# Patient Record
Sex: Female | Born: 1970 | Race: White | Hispanic: No | Marital: Married | Smoking: Current every day smoker
Health system: Southern US, Community
[De-identification: ages and names within clinical notes are randomized; demographics above are authoritative.]

---

## 1999-12-17 ENCOUNTER — Other Ambulatory Visit: Admission: RE | Admit: 1999-12-17 | Discharge: 1999-12-17 | Payer: Self-pay | Admitting: Obstetrics & Gynecology

## 2001-01-16 ENCOUNTER — Other Ambulatory Visit: Admission: RE | Admit: 2001-01-16 | Discharge: 2001-01-16 | Payer: Self-pay | Admitting: Obstetrics and Gynecology

## 2002-01-21 ENCOUNTER — Other Ambulatory Visit: Admission: RE | Admit: 2002-01-21 | Discharge: 2002-01-21 | Payer: Self-pay | Admitting: Obstetrics and Gynecology

## 2003-01-12 ENCOUNTER — Other Ambulatory Visit: Admission: RE | Admit: 2003-01-12 | Discharge: 2003-01-12 | Payer: Self-pay | Admitting: Obstetrics & Gynecology

## 2004-02-02 ENCOUNTER — Ambulatory Visit (HOSPITAL_COMMUNITY): Admission: RE | Admit: 2004-02-02 | Discharge: 2004-02-02 | Payer: Self-pay | Admitting: Gynecology

## 2004-02-06 ENCOUNTER — Other Ambulatory Visit: Admission: RE | Admit: 2004-02-06 | Discharge: 2004-02-06 | Payer: Self-pay | Admitting: Obstetrics & Gynecology

## 2005-02-12 ENCOUNTER — Encounter: Admission: RE | Admit: 2005-02-12 | Discharge: 2005-02-12 | Payer: Self-pay | Admitting: Plastic Surgery

## 2005-02-18 ENCOUNTER — Ambulatory Visit (HOSPITAL_COMMUNITY): Admission: RE | Admit: 2005-02-18 | Discharge: 2005-02-18 | Payer: Self-pay | Admitting: Plastic Surgery

## 2005-02-28 ENCOUNTER — Encounter: Admission: RE | Admit: 2005-02-28 | Discharge: 2005-02-28 | Payer: Self-pay | Admitting: Plastic Surgery

## 2005-03-04 ENCOUNTER — Ambulatory Visit (HOSPITAL_COMMUNITY): Admission: RE | Admit: 2005-03-04 | Discharge: 2005-03-04 | Payer: Self-pay | Admitting: Plastic Surgery

## 2005-08-08 ENCOUNTER — Ambulatory Visit (HOSPITAL_COMMUNITY): Admission: RE | Admit: 2005-08-08 | Discharge: 2005-08-08 | Payer: Self-pay | Admitting: Plastic Surgery

## 2005-11-12 ENCOUNTER — Other Ambulatory Visit: Admission: RE | Admit: 2005-11-12 | Discharge: 2005-11-12 | Payer: Self-pay | Admitting: Obstetrics & Gynecology

## 2007-11-10 ENCOUNTER — Encounter: Admission: RE | Admit: 2007-11-10 | Discharge: 2007-11-10 | Payer: Self-pay | Admitting: Plastic Surgery

## 2007-12-10 ENCOUNTER — Encounter: Admission: RE | Admit: 2007-12-10 | Discharge: 2008-03-09 | Payer: Self-pay | Admitting: Family Medicine

## 2008-08-25 ENCOUNTER — Encounter: Admission: RE | Admit: 2008-08-25 | Discharge: 2008-08-25 | Payer: Self-pay | Admitting: Plastic Surgery

## 2009-06-09 ENCOUNTER — Encounter: Admission: RE | Admit: 2009-06-09 | Discharge: 2009-06-09 | Payer: Self-pay | Admitting: Plastic Surgery

## 2010-07-13 ENCOUNTER — Encounter: Admission: RE | Admit: 2010-07-13 | Discharge: 2010-07-13 | Payer: Self-pay | Admitting: Family Medicine

## 2010-10-15 ENCOUNTER — Encounter: Admission: RE | Admit: 2010-10-15 | Discharge: 2010-10-15 | Payer: Self-pay | Admitting: Family Medicine

## 2011-04-12 NOTE — Op Note (Signed)
Joyce Trevino, Joyce Trevino            ACCOUNT NO.:  [id number]   MEDICAL RECORD NO.:  [id number]          PATIENT TYPE:  AMB   LOCATION:  SDS                          FACILITY:  [facility]   PHYSICIAN:  Brantley Persons, M.D.DATE OF BIRTH:  02-Jun-1971   DATE OF PROCEDURE:  08/08/2005  DATE OF DISCHARGE:  08/08/2005                                 OPERATIVE REPORT   PREOPERATIVE DIAGNOSIS:  Absence of left breast implant, status post left  mastopexy augmentation, with subsequent implant infection.   POSTOPERATIVE DIAGNOSIS:  Absence of left breast implant, status post left  mastopexy augmentation, with subsequent implant infection.   PROCEDURE:  Revisional left breast augmentation.   ATTENDING SURGEON:  Leodis Liverpool, MD.   ANESTHESIA:  General endotracheal.   ANESTHESIOLOGIST:  Sheldon Silvan, MD.   ESTIMATED BLOOD LOSS:  150 mL.   FLUID REPLACEMENT:  1900 mL crystalloid.   COMPLICATIONS:  None.   INDICATIONS FOR PROCEDURE:  The patient is a 40 year old Caucasian female,  who has previously undergone a bilateral periareolar mastopexy augmentation  in January of 2006.  This had actually been a revisional mastopexy  augmentation in which she had larger implants placed.  Postoperatively she  developed a seroma and a wound infection in the left breast implant and  wound.  This required drainage of the seroma and removal of the left breast  implant.  The patient's implant did grow out methicillin-resistant Staph  aureus that has been appropriately treated as per guidelines given by Dr.  Burnice Logan of the Infectious Disease team.  Clinically, the patient  appears to have no further symptoms of the infection present, and it has  been a good four months since the implant was removed.  She now presents to  undergo re-augmentation of the left breast since she would like to have a  comparable size and shape to her two breasts.  The right nipple-areolar  complex has stretched out  some due to the tension of the right implant and  it is larger than the nipple-areolar complex on the left.  At this point, I  will not proceed with providing a revision of the nipple-areolar complexes,  rather we will work on re-augmenting the left breast and then allowing  things to settle in and decide what else may need to be performed in order  to give her better symmetry between the nipple-areolar complexes.  The left  nipple-areolar complex is significantly smaller than the right at this  point.   DESCRIPTION OF PROCEDURE:  The patient was marked in the preop holding area  for future left breast augmentation, then she was taken back to the OR and  placed on the table in the supine position.  After adequate general  endotracheal anesthesia was obtained, the patient's chest was prepped with  Betadine and alcohol, and draped in a sterile fashion.  The base of the left  breast was then injected with a combination of 1% Lidocaine with  epinephrine, half-percent Marcaine plain, and sterile water.  After the  appropriate amount had been injected into the base of the left breast, we  then proceeded  with the surgery.  The breast and chest were prepped with  Betadine and alcohol, and draped in a sterile fashion.  I used a left  inferolateral, periareolar incision for access to the breast tissue.  The  incision was made with the knife and then carried down through the  subcutaneous tissues using the Bovie electrocautery.  Bleeding was stopped  by both the electrocautery as well as by light pressure.  As the breast  tissue was encountered, I then proceeded to divide the breast tissue with  the Bovie electrocautery and go more lateral, reaching the lateral border of  the pectoralis major muscle.  The fascia along the lateral aspect of the  pectoralis major muscle was identified.  I then incised the fascia and this  allowed access to the subpectoral pocket.  There was a significant scarring   present both on the pectoral fascia as well as in the subfascial plane.  This likely was due to her previous surgery and the healing in from that.  The pectoral muscle was then elevated, and a subpectoral pocket was created  using both blunt as well as electrocautery dissection.  After an appropriate  pocket had been created, the wound was then irrigated with saline irrigation  followed by antibiotic irrigation using Bacitracin and Polymyxin.  Next, a  Mentor, Reference #[id number], Lot [id number], 425 mL, round, smooth, saline  implant was placed and filled to a total of 475 mL.  With her previous  surgery, the patient had a 425 mL, round, smooth, Spectrum Mentor implant  placed and filled to a total volume of 460 mL, however, since she did have  the infection and had a small amount of tissue debrided, she likely may have  lost a little bit of volume so I felt that the extra 15 mL would be  appropriate.  Also, in comparison between the breasts, it appeared that the  right breast may have been just a little larger compared to the left when  only 460 mL were present.  The 475 mL seemed to give a better balance.   The patient was then placed in the upright position.  Some minor  modifications were made to the implant pocket.  There was a small amount of  puckering present along the left lower quadrant healed incision that was  present and, in order to smooth this out, I performed just a small amount of  defatting along part of the ends of the repaired incision in that area.  This appeared to allow it to lay down smoother and nicer for the patient.  The patient's breast contours were once again compared, and she was found to  have good symmetry between her size and shape.  The inferior aspect of both  implants appeared to be in a similar position, and the superior, lateral,  medial contours also appeared to be rather symmetric.  The patient was then placed back into the recumbent position.  The  posterior field tube was  removed from the Spectrum implant, and the tubing was checked and found to  be properly seated.  I then proceeded with closure of the periareolar  incision.  The deeper subcutaneous tissues were closed using 3-0 Monocryl  suture.  The dermal layer was closed using 4-0 Monocryl suture.  The skin  was then closed with a 5-0 Monocryl in a running, intracuticular stitch.  Prior to closing the wound, the wound had been irrigated with both saline as  well as antibiotic irrigation.  The patient had tolerated the procedure  well.  Meticulous hemostasis had been obtained with the Bovie  electrocautery.  The periareolar incision was dressed with Benzoin and Steri-  Strips.  Some Elastoplast tape was then placed around the base of the re-  augmented left breast as well as laterally just to give a small amount of  support there while everything is healing in.  The patient then had  Bacitracin ointment applied over the nipple followed by an Adaptic and 4x4s.  There were no complications.  The patient tolerated the procedure well.  She  was then extubated and taken to the recovery room in stable condition.  Both  the patient and her husband were given proper postoperative wound care  instructions, then she was discharged home in his care in stable condition.  A followup appointment will be tomorrow in the office.           ______________________________  Brantley Persons, M.D.     MC/MEDQ  D:  08/09/2005  T:  08/10/2005  Job:  [id number]

## 2012-09-04 ENCOUNTER — Other Ambulatory Visit: Payer: Self-pay | Admitting: Obstetrics & Gynecology

## 2012-09-04 DIAGNOSIS — R928 Other abnormal and inconclusive findings on diagnostic imaging of breast: Secondary | ICD-10-CM

## 2012-09-11 ENCOUNTER — Other Ambulatory Visit: Payer: Self-pay

## 2012-09-29 ENCOUNTER — Ambulatory Visit
Admission: RE | Admit: 2012-09-29 | Discharge: 2012-09-29 | Disposition: A | Discharge status: Home / Self Care | Payer: PRIVATE HEALTH INSURANCE | Source: Ambulatory Visit | Attending: Obstetrics & Gynecology | Admitting: Obstetrics & Gynecology

## 2012-09-29 DIAGNOSIS — R928 Other abnormal and inconclusive findings on diagnostic imaging of breast: Secondary | ICD-10-CM

## 2013-04-28 ENCOUNTER — Other Ambulatory Visit: Payer: Self-pay | Admitting: Family Medicine

## 2013-04-28 DIAGNOSIS — N6313 Unspecified lump in the right breast, lower outer quadrant: Secondary | ICD-10-CM

## 2013-04-28 DIAGNOSIS — N6314 Unspecified lump in the right breast, lower inner quadrant: Secondary | ICD-10-CM

## 2013-05-12 ENCOUNTER — Ambulatory Visit
Admission: RE | Admit: 2013-05-12 | Discharge: 2013-05-12 | Disposition: A | Discharge status: Home / Self Care | Payer: PRIVATE HEALTH INSURANCE | Source: Ambulatory Visit | Attending: Family Medicine | Admitting: Family Medicine

## 2013-05-12 DIAGNOSIS — N6313 Unspecified lump in the right breast, lower outer quadrant: Secondary | ICD-10-CM

## 2013-05-12 DIAGNOSIS — N6314 Unspecified lump in the right breast, lower inner quadrant: Secondary | ICD-10-CM

## 2016-06-05 ENCOUNTER — Encounter (HOSPITAL_COMMUNITY): Payer: Self-pay | Admitting: Emergency Medicine

## 2016-06-05 DIAGNOSIS — R42 Dizziness and giddiness: Secondary | ICD-10-CM | POA: Insufficient documentation

## 2016-06-05 DIAGNOSIS — F17200 Nicotine dependence, unspecified, uncomplicated: Secondary | ICD-10-CM | POA: Insufficient documentation

## 2016-06-05 DIAGNOSIS — R5383 Other fatigue: Secondary | ICD-10-CM | POA: Diagnosis present

## 2016-06-05 NOTE — ED Notes (Signed)
Pt. reports generalized weakness , dizziness and fatigue onset this evening with bilateral arms tingling / pain .

## 2016-06-06 ENCOUNTER — Emergency Department (HOSPITAL_COMMUNITY)
Admission: EM | Admit: 2016-06-06 | Discharge: 2016-06-06 | Disposition: A | Discharge status: Home / Self Care | Payer: PRIVATE HEALTH INSURANCE | Attending: Emergency Medicine | Admitting: Emergency Medicine

## 2016-06-06 DIAGNOSIS — R42 Dizziness and giddiness: Secondary | ICD-10-CM

## 2016-06-06 LAB — COMPREHENSIVE METABOLIC PANEL
ALT: 21 U/L (ref 14–54)
AST: 19 U/L (ref 15–41)
Albumin: 4.1 g/dL (ref 3.5–5.0)
Alkaline Phosphatase: 58 U/L (ref 38–126)
Anion gap: 8 (ref 5–15)
BUN: 23 mg/dL — ABNORMAL HIGH (ref 6–20)
CHLORIDE: 103 mmol/L (ref 101–111)
CO2: 25 mmol/L (ref 22–32)
Calcium: 9.5 mg/dL (ref 8.9–10.3)
Creatinine, Ser: 0.97 mg/dL (ref 0.44–1.00)
GLUCOSE: 101 mg/dL — AB (ref 65–99)
POTASSIUM: 3.4 mmol/L — AB (ref 3.5–5.1)
SODIUM: 136 mmol/L (ref 135–145)
Total Bilirubin: 0.5 mg/dL (ref 0.3–1.2)
Total Protein: 7.3 g/dL (ref 6.5–8.1)

## 2016-06-06 LAB — CBC WITH DIFFERENTIAL/PLATELET
BASOS ABS: 0 10*3/uL (ref 0.0–0.1)
Basophils Relative: 0 %
EOS ABS: 0.1 10*3/uL (ref 0.0–0.7)
EOS PCT: 1 %
HCT: 44.6 % (ref 36.0–46.0)
Hemoglobin: 15.4 g/dL — ABNORMAL HIGH (ref 12.0–15.0)
LYMPHS PCT: 37 %
Lymphs Abs: 3.7 10*3/uL (ref 0.7–4.0)
MCH: 31.4 pg (ref 26.0–34.0)
MCHC: 34.5 g/dL (ref 30.0–36.0)
MCV: 90.8 fL (ref 78.0–100.0)
MONO ABS: 0.6 10*3/uL (ref 0.1–1.0)
Monocytes Relative: 6 %
Neutro Abs: 5.6 10*3/uL (ref 1.7–7.7)
Neutrophils Relative %: 56 %
PLATELETS: 306 10*3/uL (ref 150–400)
RBC: 4.91 MIL/uL (ref 3.87–5.11)
RDW: 12.2 % (ref 11.5–15.5)
WBC: 10.1 10*3/uL (ref 4.0–10.5)

## 2016-06-06 LAB — URINALYSIS, ROUTINE W REFLEX MICROSCOPIC
Bilirubin Urine: NEGATIVE
GLUCOSE, UA: NEGATIVE mg/dL
Hgb urine dipstick: NEGATIVE
KETONES UR: NEGATIVE mg/dL
LEUKOCYTES UA: NEGATIVE
NITRITE: NEGATIVE
PH: 7 (ref 5.0–8.0)
Protein, ur: NEGATIVE mg/dL
SPECIFIC GRAVITY, URINE: 1.014 (ref 1.005–1.030)

## 2016-06-06 LAB — TROPONIN I: Troponin I: <0.03 ng/mL

## 2016-06-06 LAB — POC URINE PREG, ED: Preg Test, Ur: NEGATIVE

## 2016-06-06 MED ORDER — SODIUM CHLORIDE 0.9 % IV BOLUS (SEPSIS)
1000.0000 mL | Freq: Once | INTRAVENOUS | Status: AC
  Administered 2016-06-06: 1000 mL via INTRAVENOUS

## 2016-06-06 NOTE — ED Notes (Signed)
Pt departed in NAD, refused wheelchair.  

## 2016-06-06 NOTE — ED Provider Notes (Signed)
CSN: [id number]     Arrival date & time 06/05/16  2330 History  By signing my name below, I, Freida Busmaniana Omoyeni, attest that this documentation has been prepared under the direction and in the presence of Pricilla LovelessScott Cuba Natarajan, MD . Electronically Signed: Freida Busmaniana Omoyeni, Scribe. 06/06/2016. 3:13 AM.  Chief Complaint  Patient presents with  . Fatigue  . Dizziness   The history is provided by the patient. No language interpreter was used.     HPI Comments:  Alger MemosSherry A Rohrig is a 45 y.o. female who presents to the Emergency Department complaining of lightheadedness which began ~2100. She reports associated LUE pain and tingling in her bilateral hands, which has improved at this time.  She notes recent change in her diet as she is trying to lose weight; states she has cut down on her "starches".  Pt reports h/o similar episodes but usually in the AM. She reports taking a glucose tablet and drinking orange juice without relief PTA. She denies CP, nausea, vomiting, abdominal pain, and SOB. Pt is a current smoker.   History reviewed. No pertinent past medical history. History reviewed. No pertinent past surgical history. No family history on file. Social History  Substance Use Topics  . Smoking status: Current Every Day Smoker  . Smokeless tobacco: None  . Alcohol Use: Yes   OB History    No data available     Review of Systems  Constitutional: Negative for fever and chills.  Respiratory: Negative for shortness of breath.   Cardiovascular: Negative for chest pain.  Gastrointestinal: Negative for nausea, vomiting and abdominal pain.  Neurological: Positive for light-headedness.  All other systems reviewed and are negative.   Allergies  Penicillins  Home Medications   Prior to Admission medications   Not on File   BP 149/107 mmHg  Pulse 98  Temp(Src) 98 F (36.7 C) (Oral)  Resp 16  Ht 5\' 5"  (1.651 m)  Wt 172 lb (78.019 kg)  BMI 28.62 kg/m2  SpO2 97%  LMP 05/28/2016  (Approximate) Physical Exam  Constitutional: She is oriented to person, place, and time. She appears well-developed and well-nourished.  HENT:  Head: Normocephalic and atraumatic.  Right Ear: External ear normal.  Left Ear: External ear normal.  Nose: Nose normal.  Eyes: Right eye exhibits no discharge. Left eye exhibits no discharge.  Cardiovascular: Normal rate, regular rhythm and normal heart sounds.   Pulses:      Radial pulses are 2+ on the right side, and 2+ on the left side.  Pulmonary/Chest: Effort normal and breath sounds normal.  Abdominal: Soft. There is no tenderness.  Neurological: She is alert and oriented to person, place, and time.  CN 3-12 grossly intact 5/5 strength in all 4 extremities Grossly normal sensation Normal finger to nose and normal gait   Skin: Skin is warm and dry.  Nursing note and vitals reviewed.   ED Course  Procedures   DIAGNOSTIC STUDIES:  Oxygen Saturation is 97% on RA, normal by my interpretation.    COORDINATION OF CARE:  3:07 AM Discussed treatment plan with pt at bedside and pt agreed to plan.  Labs Review Labs Reviewed  CBC WITH DIFFERENTIAL/PLATELET - Abnormal; Notable for the following:    Hemoglobin 15.4 (*)    All other components within normal limits  COMPREHENSIVE METABOLIC PANEL - Abnormal; Notable for the following:    Potassium 3.4 (*)    Glucose, Bld 101 (*)    BUN 23 (*)    All other  components within normal limits  URINALYSIS, ROUTINE W REFLEX MICROSCOPIC (NOT AT [facility])  TROPONIN I  POC URINE PREG, ED    Imaging Review No results found. I have personally reviewed and evaluated these images and lab results as part of my medical decision-making.   EKG Interpretation   Date/Time:  Thursday June 06 2016 03:25:53 EDT Ventricular Rate:  76 PR Interval:    QRS Duration: 92 QT Interval:  394 QTC Calculation: 443 R Axis:   98 Text Interpretation:  Sinus rhythm Borderline right axis deviation no  acute ST/T  changes No old tracing to compare Confirmed by Sharifa Bucholz MD,  Hannalee Castor [phone]) on 06/06/2016 3:38:30 AM      MDM   Final diagnoses:  Lightheadedness    Patient's lightheadedness tonight as well as recently is probably due to diet changes and decreased carbs. Unclear if she was specifically hypoglycemic as she had already taken glucose and juice prior to arrival. However I counseled her on talking to her PCP and dietitian about these diet changes. Could be a little dehydrated based on mildly elevated BUN and elevated hemoglobin. Feels better after fluids. She describes vague left arm pain but no chest pain. With benign ECG and negative troponin several hours after the symptoms I highly doubt ACS. Follow-up with PCP. Discussed return precautions.  I personally performed the services described in this documentation, which was scribed in my presence. The recorded information has been reviewed and is accurate.    Pricilla Loveless, MD 06/06/16 [phone]

## 2019-03-26 ENCOUNTER — Ambulatory Visit
Admission: RE | Admit: 2019-03-26 | Discharge: 2019-03-26 | Disposition: A | Discharge status: Home / Self Care | Payer: PRIVATE HEALTH INSURANCE | Source: Ambulatory Visit | Attending: Family Medicine | Admitting: Family Medicine

## 2019-03-26 ENCOUNTER — Other Ambulatory Visit: Payer: Self-pay | Admitting: Family Medicine

## 2019-03-26 DIAGNOSIS — R059 Cough, unspecified: Secondary | ICD-10-CM

## 2019-03-26 DIAGNOSIS — R05 Cough: Secondary | ICD-10-CM

## 2019-06-18 ENCOUNTER — Other Ambulatory Visit: Payer: Self-pay | Admitting: Family Medicine

## 2019-06-24 ENCOUNTER — Other Ambulatory Visit (HOSPITAL_COMMUNITY)
Admission: RE | Admit: 2019-06-24 | Discharge: 2019-06-24 | Disposition: A | Discharge status: Home / Self Care | Payer: PRIVATE HEALTH INSURANCE | Source: Ambulatory Visit | Attending: Family Medicine | Admitting: Family Medicine

## 2019-06-24 DIAGNOSIS — Z20828 Contact with and (suspected) exposure to other viral communicable diseases: Secondary | ICD-10-CM | POA: Insufficient documentation

## 2019-06-24 LAB — SARS CORONAVIRUS 2 (TAT 6-24 HRS): SARS Coronavirus 2: NEGATIVE

## 2019-06-25 ENCOUNTER — Other Ambulatory Visit: Payer: Self-pay | Admitting: *Deleted

## 2019-06-25 DIAGNOSIS — R0600 Dyspnea, unspecified: Secondary | ICD-10-CM

## 2019-06-28 ENCOUNTER — Other Ambulatory Visit: Payer: Self-pay

## 2019-06-28 ENCOUNTER — Ambulatory Visit (INDEPENDENT_AMBULATORY_CARE_PROVIDER_SITE_OTHER): Payer: PRIVATE HEALTH INSURANCE | Admitting: Internal Medicine

## 2019-06-28 DIAGNOSIS — R0600 Dyspnea, unspecified: Secondary | ICD-10-CM | POA: Diagnosis not present

## 2019-06-28 LAB — PULMONARY FUNCTION TEST
DL/VA % pred: 82 %
DL/VA: 3.54 ml/min/mmHg/L
DLCO unc % pred: 87 %
DLCO unc: 18.69 ml/min/mmHg
FEF 25-75 Post: 1.89 L/sec
FEF 25-75 Pre: 1.24 L/sec
FEF2575-%Change-Post: 52 %
FEF2575-%Pred-Post: 67 %
FEF2575-%Pred-Pre: 44 %
FEV1-%Change-Post: 12 %
FEV1-%Pred-Post: 86 %
FEV1-%Pred-Pre: 76 %
FEV1-Post: 2.46 L
FEV1-Pre: 2.2 L
FEV1FVC-%Change-Post: 4 %
FEV1FVC-%Pred-Pre: 82 %
FEV6-%Change-Post: 8 %
FEV6-%Pred-Post: 99 %
FEV6-%Pred-Pre: 92 %
FEV6-Post: 3.5 L
FEV6-Pre: 3.24 L
FEV6FVC-%Change-Post: 1 %
FEV6FVC-%Pred-Post: 101 %
FEV6FVC-%Pred-Pre: 100 %
FVC-%Change-Post: 6 %
FVC-%Pred-Post: 98 %
FVC-%Pred-Pre: 91 %
FVC-Post: 3.53 L
FVC-Pre: 3.3 L
Post FEV1/FVC ratio: 70 %
Post FEV6/FVC ratio: 99 %
Pre FEV1/FVC ratio: 67 %
Pre FEV6/FVC Ratio: 98 %
RV % pred: 167 %
RV: 3.04 L
TLC % pred: 123 %
TLC: 6.33 L

## 2019-06-28 NOTE — Progress Notes (Signed)
PFT done today. 

## 2019-08-11 ENCOUNTER — Telehealth: Payer: Self-pay | Admitting: Internal Medicine

## 2019-08-11 NOTE — Telephone Encounter (Signed)
Spoke with the Joyce Trevino  She states that Dr Kathyrn Lass order the PFT but is giving her the run around about going over the results  She is wondering if Dr Annamaria Boots can go over the results of her PFT  Please advise thanks

## 2019-08-12 NOTE — Telephone Encounter (Signed)
Spoke with the pt and notified of recs per CDY  She verbalized understanding  Will f/u with her PCP

## 2019-08-12 NOTE — Telephone Encounter (Signed)
The PFT showed mild obstructive airways disease, with slight improvement after she inhaled the bronchodilator. These results will need to be interpreted in her particular case by the doctor who knows her and knows what the clinical situation is.

## 2021-04-17 IMAGING — CR CHEST - 2 VIEW
2 series · 2 of 2 positions shown · non-contrast
Comparison: None.

CLINICAL DATA: Shortness of breath and cough for several weeks.

EXAM:
CHEST - 2 VIEW

[w chest pa]
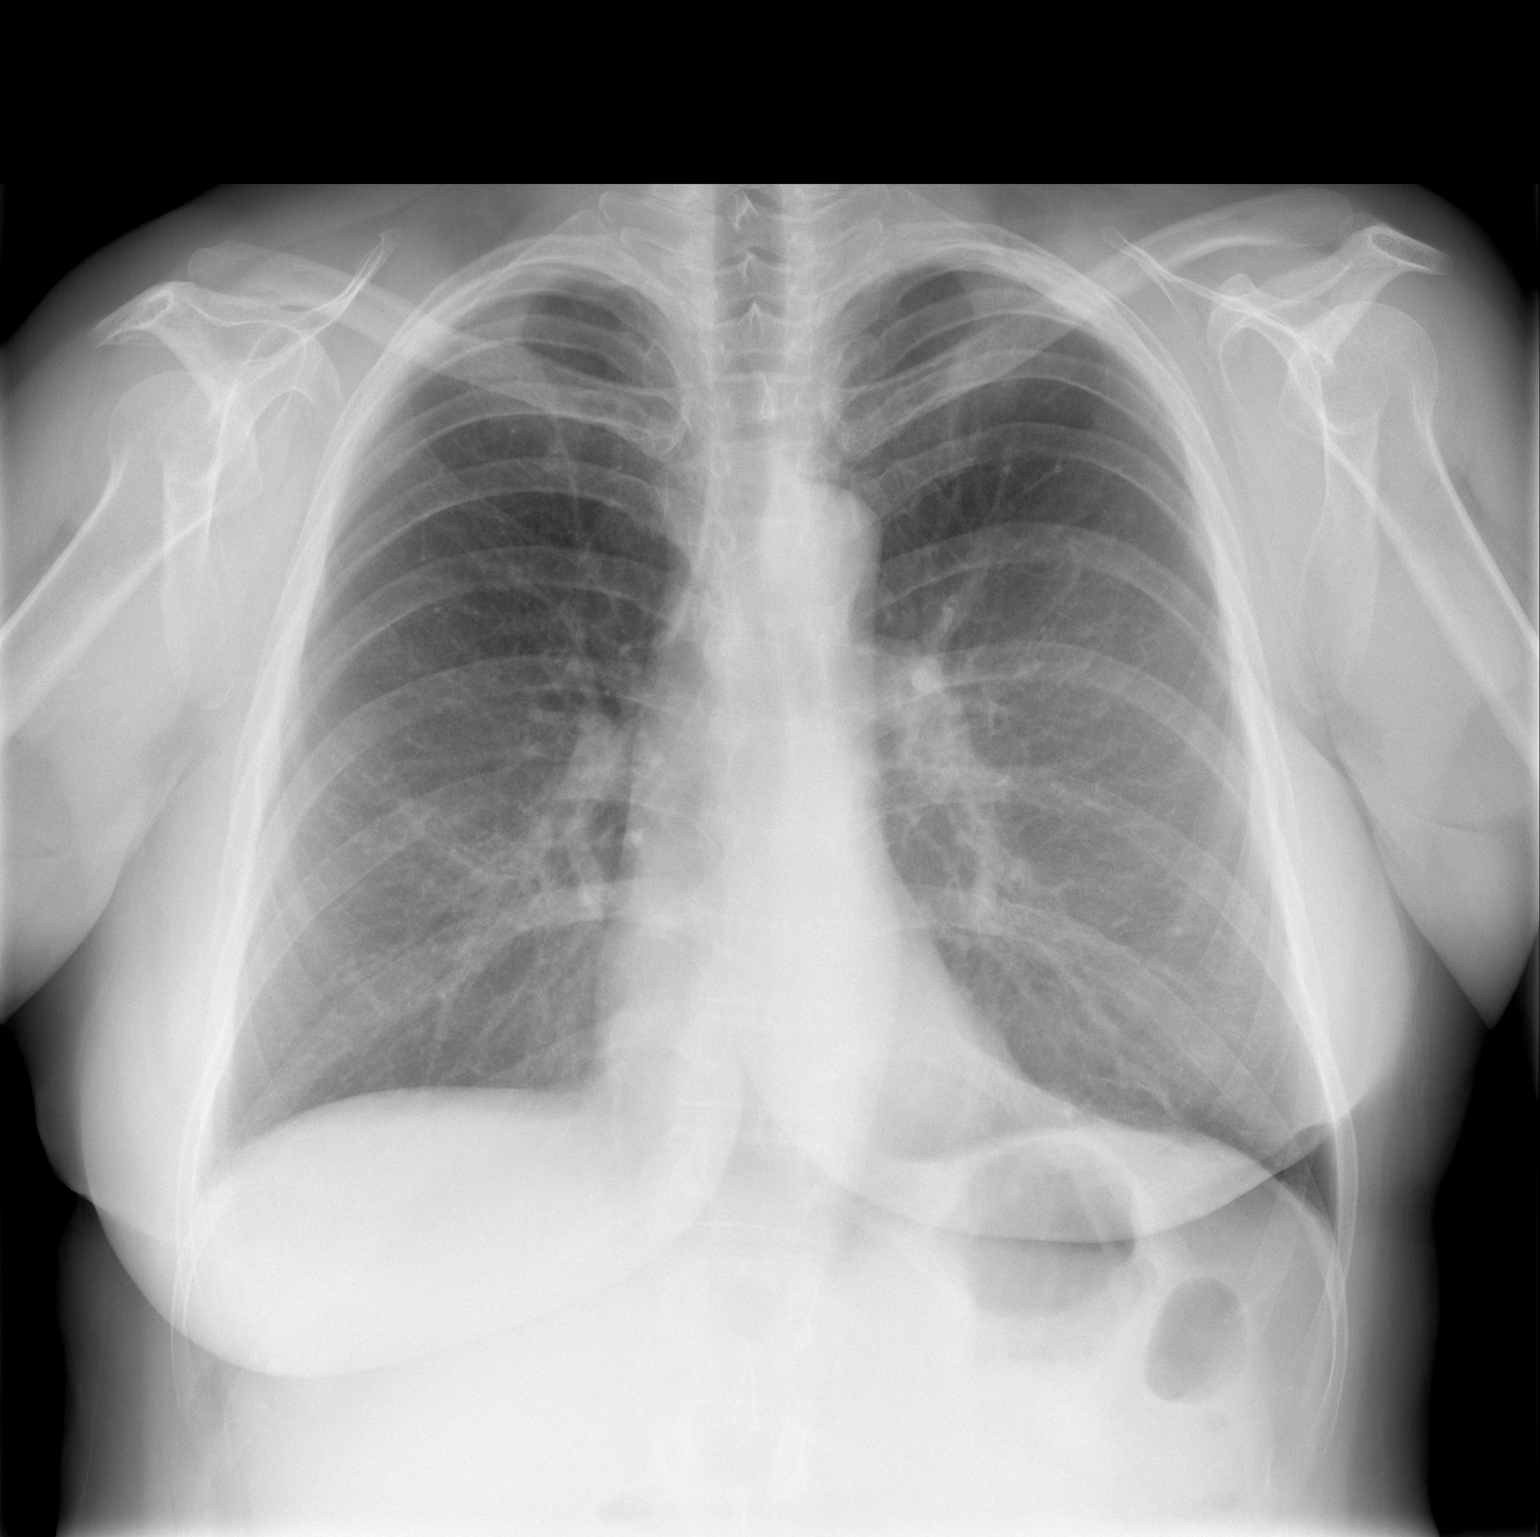

[w chest lat]
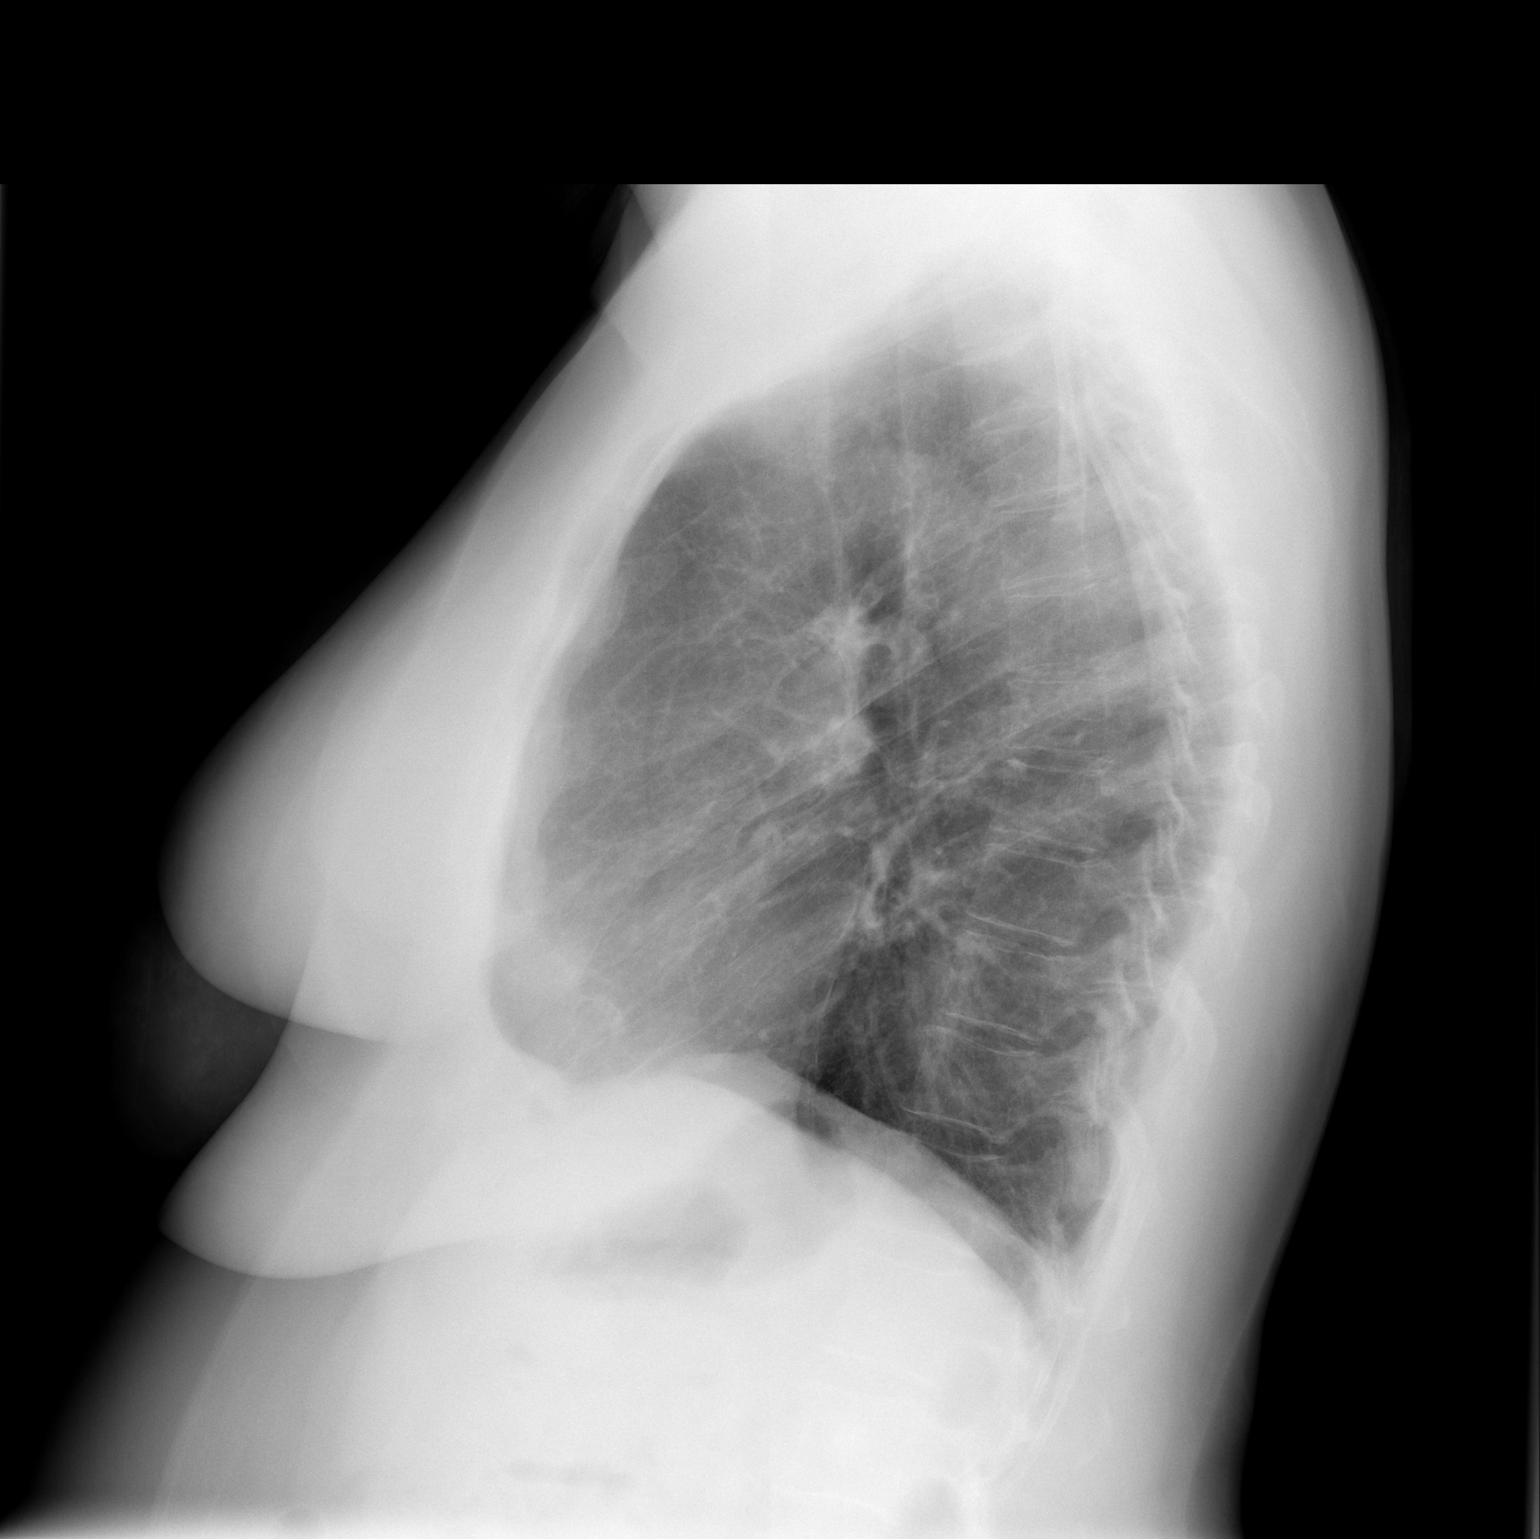

[2 of 2 positions shown; findings below may reference images not displayed]

FINDINGS: The heart size and mediastinal contours are within normal limits.
There is no focal infiltrate, pulmonary edema, or pleural effusion.
The visualized skeletal structures are unremarkable.
IMPRESSION: No active cardiopulmonary disease.

## 2023-08-07 DIAGNOSIS — M9902 Segmental and somatic dysfunction of thoracic region: Secondary | ICD-10-CM | POA: Diagnosis not present

## 2023-08-07 DIAGNOSIS — M531 Cervicobrachial syndrome: Secondary | ICD-10-CM | POA: Diagnosis not present

## 2023-08-07 DIAGNOSIS — M9905 Segmental and somatic dysfunction of pelvic region: Secondary | ICD-10-CM | POA: Diagnosis not present

## 2023-08-07 DIAGNOSIS — M9903 Segmental and somatic dysfunction of lumbar region: Secondary | ICD-10-CM | POA: Diagnosis not present

## 2023-08-07 DIAGNOSIS — M5386 Other specified dorsopathies, lumbar region: Secondary | ICD-10-CM | POA: Diagnosis not present

## 2023-08-14 DIAGNOSIS — M531 Cervicobrachial syndrome: Secondary | ICD-10-CM | POA: Diagnosis not present

## 2023-08-14 DIAGNOSIS — M9903 Segmental and somatic dysfunction of lumbar region: Secondary | ICD-10-CM | POA: Diagnosis not present

## 2023-08-14 DIAGNOSIS — M9905 Segmental and somatic dysfunction of pelvic region: Secondary | ICD-10-CM | POA: Diagnosis not present

## 2023-08-14 DIAGNOSIS — M5386 Other specified dorsopathies, lumbar region: Secondary | ICD-10-CM | POA: Diagnosis not present

## 2023-08-18 DIAGNOSIS — M5386 Other specified dorsopathies, lumbar region: Secondary | ICD-10-CM | POA: Diagnosis not present

## 2023-08-18 DIAGNOSIS — M9905 Segmental and somatic dysfunction of pelvic region: Secondary | ICD-10-CM | POA: Diagnosis not present

## 2023-08-18 DIAGNOSIS — M531 Cervicobrachial syndrome: Secondary | ICD-10-CM | POA: Diagnosis not present

## 2023-08-18 DIAGNOSIS — M9903 Segmental and somatic dysfunction of lumbar region: Secondary | ICD-10-CM | POA: Diagnosis not present

## 2023-09-01 DIAGNOSIS — M9903 Segmental and somatic dysfunction of lumbar region: Secondary | ICD-10-CM | POA: Diagnosis not present

## 2023-09-01 DIAGNOSIS — M5386 Other specified dorsopathies, lumbar region: Secondary | ICD-10-CM | POA: Diagnosis not present

## 2023-09-01 DIAGNOSIS — M531 Cervicobrachial syndrome: Secondary | ICD-10-CM | POA: Diagnosis not present

## 2023-09-01 DIAGNOSIS — M9905 Segmental and somatic dysfunction of pelvic region: Secondary | ICD-10-CM | POA: Diagnosis not present

## 2023-09-15 DIAGNOSIS — M5386 Other specified dorsopathies, lumbar region: Secondary | ICD-10-CM | POA: Diagnosis not present

## 2023-09-15 DIAGNOSIS — M531 Cervicobrachial syndrome: Secondary | ICD-10-CM | POA: Diagnosis not present

## 2023-09-15 DIAGNOSIS — M9903 Segmental and somatic dysfunction of lumbar region: Secondary | ICD-10-CM | POA: Diagnosis not present

## 2023-09-15 DIAGNOSIS — M9905 Segmental and somatic dysfunction of pelvic region: Secondary | ICD-10-CM | POA: Diagnosis not present

## 2023-09-22 DIAGNOSIS — M9905 Segmental and somatic dysfunction of pelvic region: Secondary | ICD-10-CM | POA: Diagnosis not present

## 2023-09-22 DIAGNOSIS — M5386 Other specified dorsopathies, lumbar region: Secondary | ICD-10-CM | POA: Diagnosis not present

## 2023-09-22 DIAGNOSIS — M531 Cervicobrachial syndrome: Secondary | ICD-10-CM | POA: Diagnosis not present

## 2023-09-22 DIAGNOSIS — M9903 Segmental and somatic dysfunction of lumbar region: Secondary | ICD-10-CM | POA: Diagnosis not present

## 2023-09-29 DIAGNOSIS — M9902 Segmental and somatic dysfunction of thoracic region: Secondary | ICD-10-CM | POA: Diagnosis not present

## 2023-09-29 DIAGNOSIS — M9901 Segmental and somatic dysfunction of cervical region: Secondary | ICD-10-CM | POA: Diagnosis not present

## 2023-09-29 DIAGNOSIS — M5386 Other specified dorsopathies, lumbar region: Secondary | ICD-10-CM | POA: Diagnosis not present

## 2023-09-29 DIAGNOSIS — M530 Cervicocranial syndrome: Secondary | ICD-10-CM | POA: Diagnosis not present

## 2023-10-01 DIAGNOSIS — R42 Dizziness and giddiness: Secondary | ICD-10-CM | POA: Diagnosis not present

## 2023-10-09 DIAGNOSIS — R42 Dizziness and giddiness: Secondary | ICD-10-CM | POA: Diagnosis not present

## 2023-10-15 ENCOUNTER — Encounter: Payer: Self-pay | Admitting: Physician Assistant

## 2023-10-15 ENCOUNTER — Ambulatory Visit (INDEPENDENT_AMBULATORY_CARE_PROVIDER_SITE_OTHER): Payer: BC Managed Care – PPO | Admitting: Physician Assistant

## 2023-10-15 VITALS — BP 145/89 | HR 85 | Resp 17 | Ht 65.0 in | Wt 194.2 lb

## 2023-10-15 DIAGNOSIS — J4520 Mild intermittent asthma, uncomplicated: Secondary | ICD-10-CM | POA: Diagnosis not present

## 2023-10-15 DIAGNOSIS — R7303 Prediabetes: Secondary | ICD-10-CM

## 2023-10-15 DIAGNOSIS — R42 Dizziness and giddiness: Secondary | ICD-10-CM | POA: Diagnosis not present

## 2023-10-15 DIAGNOSIS — N390 Urinary tract infection, site not specified: Secondary | ICD-10-CM | POA: Diagnosis not present

## 2023-10-15 DIAGNOSIS — R030 Elevated blood-pressure reading, without diagnosis of hypertension: Secondary | ICD-10-CM | POA: Insufficient documentation

## 2023-10-15 DIAGNOSIS — F419 Anxiety disorder, unspecified: Secondary | ICD-10-CM | POA: Insufficient documentation

## 2023-10-15 MED ORDER — ESCITALOPRAM OXALATE 10 MG PO TABS: 10.0000 mg | ORAL_TABLET | Freq: Every day | ORAL | 1 refills | Status: AC

## 2023-10-15 MED ORDER — ALBUTEROL SULFATE HFA 108 (90 BASE) MCG/ACT IN AERS: 2.0000 | INHALATION_SPRAY | Freq: Four times a day (QID) | RESPIRATORY_TRACT | 2 refills | Status: DC | PRN

## 2023-10-15 MED ORDER — ESTRADIOL 0.1 MG/GM VA CREA: 1.0000 | TOPICAL_CREAM | VAGINAL | 3 refills | Status: AC

## 2023-10-15 NOTE — Assessment & Plan Note (Signed)
Chronic, well controlled with lexapro 10 mg , continue

## 2023-10-15 NOTE — Assessment & Plan Note (Signed)
Mild asthma symptoms post-COVID, exacerbated by seasonal allergies.  - Prescribe inhaler for as-needed use - Consider allergy medication during high pollen seasons

## 2023-10-15 NOTE — Progress Notes (Signed)
New patient visit   Patient: Joyce Trevino   DOB: [birth year]   52 y.o. Female  MRN: [id number] Visit Date: 10/15/2023  Today's healthcare provider: Alfredia Ferguson, PA-C   Cc. New patient, vertigo, menopausal sxs  Subjective    Joyce Trevino is a 52 y.o. female who presents today as a new patient to establish care.   Discussed the use of AI scribe software for clinical note transcription with the patient, who gave verbal consent to proceed.  History of Present Illness   The patient presents with vertigo that has been affecting her daily activities and causing anxiety. The vertigo started earlier this month, with the patient describing episodes of dizziness and imbalance, particularly in the mornings. The patient has been managing the vertigo with the Epley maneuver and meclizine, with some improvement noted. However, the patient still experiences episodes of vertigo that last a few hours. She saw a chiropractor who helped her perform a few maneuvers which did seem to help.  The patient reports managing frequent UTIs with a vaginal estrogen cream. She has been prescribed a new compounded medication, but has not started it yet. She was also prescribed a compounded DHEA pill.  The patient also mentions potential high blood pressure and prediabetes, which she attributes to her current weight. She expresses a desire to lose weight and improve her health. The patient also mentions a history of asthma since she was diagnosed with COVID, for which she requests an inhaler refill. Does not use regularly, but some sob can be triggered but allergens.       History reviewed. No pertinent past medical history. History reviewed. No pertinent surgical history. No family status information on file.   History reviewed. No pertinent family history. Social History   Socioeconomic History   Marital status: Married    Spouse name: Not on file   Number of children: Not on file   Years of  education: Not on file   Highest education level: Not on file  Occupational History   Not on file  Tobacco Use   Smoking status: Every Day   Smokeless tobacco: Not on file  Substance and Sexual Activity   Alcohol use: Yes   Drug use: No   Sexual activity: Not on file  Other Topics Concern   Not on file  Social History Narrative   Not on file   Social Determinants of Health   Financial Resource Strain: Not on file  Food Insecurity: Not on file  Transportation Needs: Not on file  Physical Activity: Not on file  Stress: Not on file  Social Connections: Not on file   Outpatient Medications Prior to Visit  Medication Sig   [DISCONTINUED] escitalopram (LEXAPRO) 10 MG tablet Take 10 mg by mouth daily.   [DISCONTINUED] estradiol (ESTRACE) 0.1 MG/GM vaginal cream SMARTSIG:1 Sparingly Vaginal 3 Times a Week   meclizine (ANTIVERT) 25 MG tablet Take 25 mg by mouth 2 (two) times daily as needed. (Patient not taking: Reported on 10/15/2023)   No facility-administered medications prior to visit.   Allergies  Allergen Reactions   Penicillins Other (See Comments)    Unknown      There is no immunization history on file for this patient.  Health Maintenance  Topic Date Due   HIV Screening  Never done   Hepatitis C Screening  Never done   DTaP/Tdap/Td (1 - Tdap) Never done   Cervical Cancer Screening (HPV/Pap Cotest)  Never done   Colonoscopy  Never done   MAMMOGRAM  05/11/2019   Zoster Vaccines- Shingrix (1 of 2) Never done   INFLUENZA VACCINE  Never done   COVID-19 Vaccine (1 - 2023-24 season) Never done   HPV VACCINES  Aged Out    Patient Care Team: Alfredia Ferguson, PA-C as PCP - General (Physician Assistant)  Review of Systems  Constitutional:  Negative for fatigue and fever.  Respiratory:  Negative for cough and shortness of breath.   Cardiovascular:  Negative for chest pain and leg swelling.  Gastrointestinal:  Negative for abdominal pain.  Neurological:  Positive  for dizziness. Negative for headaches.        Objective    BP (!) 145/89 (BP Location: Left Arm, Patient Position: Sitting, Cuff Size: Normal)   Pulse 85   Resp 17   Ht 5\' 5"  (1.651 m)   Wt 194 lb 3.2 oz (88.1 kg)   LMP 05/28/2016 (Approximate)   SpO2 94%   BMI 32.32 kg/m     Physical Exam Constitutional:      General: She is awake.     Appearance: She is well-developed.  HENT:     Head: Normocephalic.     Right Ear: Tympanic membrane normal.     Left Ear: Tympanic membrane normal.  Eyes:     Conjunctiva/sclera: Conjunctivae normal.  Cardiovascular:     Rate and Rhythm: Normal rate and regular rhythm.     Heart sounds: Normal heart sounds.  Pulmonary:     Effort: Pulmonary effort is normal.     Breath sounds: Normal breath sounds.  Skin:    General: Skin is warm.  Neurological:     Mental Status: She is alert and oriented to person, place, and time.  Psychiatric:        Attention and Perception: Attention normal.        Mood and Affect: Mood normal.        Speech: Speech normal.        Behavior: Behavior is cooperative.     Depression Screen    10/15/2023    4:04 PM  PHQ 2/9 Scores  PHQ - 2 Score 2   No results found for any visits on 10/15/23.  Assessment & Plan     Vertigo Assessment & Plan: Vertigo consistent with BPPV, Symptoms improving but still present in the morning. Discussed ear crystals' role in vertigo and Epley maneuver effectiveness. Advised to continue Epley maneuvers at home. If symptoms persist, referral to vestibular physical therapy may be necessary. Discussed meclizine as an emergency option.  Orders: -     VITAMIN D 25 Hydroxy (Vit-D Deficiency, Fractures) -     Vitamin B12 -     CBC with Differential/Platelet -     Comprehensive metabolic panel -     Magnesium  Anxiety Assessment & Plan: Chronic, well controlled with lexapro 10 mg , continue  Orders: -     Escitalopram Oxalate; Take 1 tablet (10 mg total) by mouth daily.   Dispense: 90 tablet; Refill: 1  Recurrent UTI Assessment & Plan: Refilled vaginal estrogen Cannot recommend DHEA supplements for menopausal symptoms.   Orders: -     Estradiol; Place 1 Applicatorful vaginally 3 (three) times a week.  Dispense: 42.5 g; Refill: 3  Mild intermittent asthma without complication Assessment & Plan: Mild asthma symptoms post-COVID, exacerbated by seasonal allergies.  - Prescribe inhaler for as-needed use - Consider allergy medication during high pollen seasons  Orders: -     Albuterol Sulfate  HFA; Inhale 2 puffs into the lungs every 6 (six) hours as needed for wheezing or shortness of breath.  Dispense: 8 g; Refill: 2  Prediabetes Assessment & Plan: Historically, will repeat a1c  Orders: -     Hemoglobin A1c  Elevated blood pressure reading in office without diagnosis of hypertension Assessment & Plan: Discussed the importance of weight loss and monitoring blood pressure. Patient prefers to avoid medication and focus on weight loss and lifestyle changes. - Recheck blood pressure in 3 months - Monitor blood pressure at home    General Health Maintenance Patient hesitant to do fasting blood work at this time. Discussed the benefits of vitamin D3, B complex, and omega supplements for overall health-- when reviewing what supplements she takes. Pt reports taking high levels of mag supplements, will check magnesium level  Follow-up - Follow up in 3 months for blood pressure recheck and review of health maintenance - Follow up as needed for vestibular physical therapy referral  Return in about 3 months (around 01/15/2024) for hypertension.      Alfredia Ferguson, PA-C  [facility] [phone] (phone) [phone] (fax)  [facility]

## 2023-10-15 NOTE — Assessment & Plan Note (Signed)
Discussed the importance of weight loss and monitoring blood pressure. Patient prefers to avoid medication and focus on weight loss and lifestyle changes. - Recheck blood pressure in 3 months - Monitor blood pressure at home

## 2023-10-15 NOTE — Assessment & Plan Note (Signed)
Refilled vaginal estrogen Cannot recommend DHEA supplements for menopausal symptoms.

## 2023-10-15 NOTE — Assessment & Plan Note (Signed)
Vertigo consistent with BPPV, Symptoms improving but still present in the morning. Discussed ear crystals' role in vertigo and Epley maneuver effectiveness. Advised to continue Epley maneuvers at home. If symptoms persist, referral to vestibular physical therapy may be necessary. Discussed meclizine as an emergency option.

## 2023-10-15 NOTE — Assessment & Plan Note (Signed)
Historically, will repeat a1c

## 2023-10-15 NOTE — Patient Instructions (Signed)
  When looking for a fish oil supplement you'll want to make sure it has both EPA and DHA in the ingredient list.   My favorite fish oil supplement is the [employer] Ultimate Omega, this is a high dose and you can see improvement faster than other supplements--but it is expensive.   A more affordable alterative is [employer] (usually also comes with vitamin D). This is a much lower dose of fish oil so it takes longer to see improvement, but is good quality and easier on the wallet.  Be sure to check the serving size for any supplement--most of the fish oil ones are 2 pills in one serving. Also, it is common to have an aftertaste to these, so taking with food or lots of water is good.  If you want a vegetarian supplement [employer] also makes an "algae omega' which doesn't actually use any fish oil.

## 2023-10-16 LAB — VITAMIN B12: Vitamin B-12: 338 pg/mL (ref 211–911)

## 2023-10-16 LAB — CBC WITH DIFFERENTIAL/PLATELET
Basophils Absolute: 0.1 10*3/uL (ref 0.0–0.1)
Basophils Relative: 0.9 % (ref 0.0–3.0)
Eosinophils Absolute: 0.1 10*3/uL (ref 0.0–0.7)
Eosinophils Relative: 1.1 % (ref 0.0–5.0)
HCT: 48.5 % — ABNORMAL HIGH (ref 36.0–46.0)
Hemoglobin: 16.4 g/dL — ABNORMAL HIGH (ref 12.0–15.0)
Lymphocytes Relative: 29.2 % (ref 12.0–46.0)
Lymphs Abs: 2.4 10*3/uL (ref 0.7–4.0)
MCHC: 33.9 g/dL (ref 30.0–36.0)
MCV: 98.4 fL (ref 78.0–100.0)
Monocytes Absolute: 0.6 10*3/uL (ref 0.1–1.0)
Monocytes Relative: 7.4 % (ref 3.0–12.0)
Neutro Abs: 5 10*3/uL (ref 1.4–7.7)
Neutrophils Relative %: 61.4 % (ref 43.0–77.0)
Platelets: 294 10*3/uL (ref 150.0–400.0)
RBC: 4.93 Mil/uL (ref 3.87–5.11)
RDW: 12.6 % (ref 11.5–15.5)
WBC: 8.1 10*3/uL (ref 4.0–10.5)

## 2023-10-16 LAB — COMPREHENSIVE METABOLIC PANEL
ALT: 22 U/L (ref 0–35)
AST: 22 U/L (ref 0–37)
Albumin: 4.3 g/dL (ref 3.5–5.2)
Alkaline Phosphatase: 75 U/L (ref 39–117)
BUN: 13 mg/dL (ref 6–23)
CO2: 28 meq/L (ref 19–32)
Calcium: 10.4 mg/dL (ref 8.4–10.5)
Chloride: 100 meq/L (ref 96–112)
Creatinine, Ser: 0.87 mg/dL (ref 0.40–1.20)
GFR: 75.53 mL/min (ref >60.00)
Glucose, Bld: 93 mg/dL (ref 70–99)
Potassium: 4.5 meq/L (ref 3.5–5.1)
Sodium: 137 meq/L (ref 135–145)
Total Bilirubin: 0.5 mg/dL (ref 0.2–1.2)
Total Protein: 7 g/dL (ref 6.0–8.3)

## 2023-10-16 LAB — VITAMIN D 25 HYDROXY (VIT D DEFICIENCY, FRACTURES): VITD: 16.83 ng/mL — ABNORMAL LOW (ref 30.00–100.00)

## 2023-10-16 LAB — HEMOGLOBIN A1C: Hgb A1c MFr Bld: 6 % (ref 4.6–6.5)

## 2023-10-16 LAB — MAGNESIUM: Magnesium: 1.8 mg/dL (ref 1.5–2.5)

## 2024-01-04 ENCOUNTER — Other Ambulatory Visit: Payer: Self-pay | Admitting: Physician Assistant

## 2024-01-04 DIAGNOSIS — J4520 Mild intermittent asthma, uncomplicated: Secondary | ICD-10-CM

## 2024-01-06 ENCOUNTER — Telehealth: Payer: Self-pay | Admitting: Physician Assistant

## 2024-01-06 NOTE — Telephone Encounter (Signed)
Called patient to get HTN f/u scheduled, along with doing a referral appointment.  Patient states she is holding off on scheduling due to her insurance not covering a lot and having to pay out of pocket for labs, etc.  Patient did not want to scheduled a f/u appointment. Patient wanted to be transferred to billing that way she could start paying her balance off.

## 2024-01-06 NOTE — Telephone Encounter (Signed)
Copied from CRM [id number]. Topic: Referral - Request for Referral >> Jan 05, 2024  4:49 PM Denese Killings wrote: Did the patient discuss referral with their provider in the last year? No (If No - schedule appointment) (If Yes - send message)  Appointment offered? Yes  Type of order/referral and detailed reason for visit:  Referral to Podiatry / n/aPatient toe nail is hurting and she has already had a tested.   Preference of office, provider, location: N/A preferably a female doctor in the [place] area  If referral order, have you been seen by this specialty before? No (If Yes, this issue or another issue? When? Where?  Can we respond through MyChart? No

## 2024-03-05 DIAGNOSIS — Z87898 Personal history of other specified conditions: Secondary | ICD-10-CM | POA: Diagnosis not present

## 2024-03-05 DIAGNOSIS — L578 Other skin changes due to chronic exposure to nonionizing radiation: Secondary | ICD-10-CM | POA: Diagnosis not present

## 2024-03-05 DIAGNOSIS — L608 Other nail disorders: Secondary | ICD-10-CM | POA: Diagnosis not present

## 2024-03-05 DIAGNOSIS — D225 Melanocytic nevi of trunk: Secondary | ICD-10-CM | POA: Diagnosis not present

## 2024-06-05 ENCOUNTER — Other Ambulatory Visit: Payer: Self-pay | Admitting: Physician Assistant

## 2024-06-05 DIAGNOSIS — J4520 Mild intermittent asthma, uncomplicated: Secondary | ICD-10-CM
